# Patient Record
Sex: Male | Born: 1990 | Race: White | Hispanic: No | Marital: Single | Smoking: Never smoker
Health system: Southern US, Community
[De-identification: ages and names within clinical notes are randomized; demographics above are authoritative.]

## PROBLEM LIST (undated history)

## (undated) DIAGNOSIS — E669 Obesity, unspecified: Secondary | ICD-10-CM

## (undated) DIAGNOSIS — E785 Hyperlipidemia, unspecified: Secondary | ICD-10-CM

## (undated) DIAGNOSIS — J45909 Unspecified asthma, uncomplicated: Secondary | ICD-10-CM

## (undated) HISTORY — DX: Unspecified asthma, uncomplicated: J45.909

## (undated) HISTORY — PX: WISDOM TOOTH EXTRACTION: SHX21

## (undated) HISTORY — DX: Hyperlipidemia, unspecified: E78.5

## (undated) HISTORY — DX: Obesity, unspecified: E66.9

---

## 2016-09-12 ENCOUNTER — Encounter: Payer: Self-pay | Admitting: *Deleted

## 2016-09-12 ENCOUNTER — Emergency Department
Admission: EM | Admit: 2016-09-12 | Discharge: 2016-09-12 | Disposition: A | Discharge status: Home / Self Care | Payer: BLUE CROSS/BLUE SHIELD | Attending: Emergency Medicine | Admitting: Emergency Medicine

## 2016-09-12 ENCOUNTER — Emergency Department: Payer: BLUE CROSS/BLUE SHIELD

## 2016-09-12 DIAGNOSIS — B349 Viral infection, unspecified: Secondary | ICD-10-CM | POA: Insufficient documentation

## 2016-09-12 DIAGNOSIS — R0981 Nasal congestion: Secondary | ICD-10-CM | POA: Diagnosis present

## 2016-09-12 LAB — CBC WITH DIFFERENTIAL/PLATELET
Basophils Absolute: 0.1 10*3/uL (ref 0–0.1)
Basophils Relative: 1 %
EOS ABS: 0.1 10*3/uL (ref 0–0.7)
Eosinophils Relative: 2 %
HEMATOCRIT: 46.6 % (ref 40.0–52.0)
HEMOGLOBIN: 16 g/dL (ref 13.0–18.0)
LYMPHS ABS: 1.4 10*3/uL (ref 1.0–3.6)
LYMPHS PCT: 22 %
MCH: 29.7 pg (ref 26.0–34.0)
MCHC: 34.4 g/dL (ref 32.0–36.0)
MCV: 86.2 fL (ref 80.0–100.0)
MONOS PCT: 9 %
Monocytes Absolute: 0.6 10*3/uL (ref 0.2–1.0)
NEUTROS ABS: 4.3 10*3/uL (ref 1.4–6.5)
NEUTROS PCT: 66 %
Platelets: 218 10*3/uL (ref 150–440)
RBC: 5.41 MIL/uL (ref 4.40–5.90)
RDW: 13 % (ref 11.5–14.5)
WBC: 6.5 10*3/uL (ref 3.8–10.6)

## 2016-09-12 LAB — BASIC METABOLIC PANEL
Anion gap: 5 (ref 5–15)
BUN: 15 mg/dL (ref 6–20)
CHLORIDE: 104 mmol/L (ref 101–111)
CO2: 28 mmol/L (ref 22–32)
CREATININE: 1 mg/dL (ref 0.61–1.24)
Calcium: 9.3 mg/dL (ref 8.9–10.3)
GFR calc non Af Amer: >60 mL/min (ref >60)
Glucose, Bld: 104 mg/dL — ABNORMAL HIGH (ref 65–99)
Potassium: 4.2 mmol/L (ref 3.5–5.1)
Sodium: 137 mmol/L (ref 135–145)

## 2016-09-12 LAB — INFLUENZA PANEL BY PCR (TYPE A & B)
INFLAPCR: NEGATIVE
INFLBPCR: NEGATIVE

## 2016-09-12 LAB — URINALYSIS COMPLETE WITH MICROSCOPIC (ARMC ONLY)
BACTERIA UA: NONE SEEN
Bilirubin Urine: NEGATIVE
GLUCOSE, UA: NEGATIVE mg/dL
Hgb urine dipstick: NEGATIVE
Ketones, ur: NEGATIVE mg/dL
Leukocytes, UA: NEGATIVE
Nitrite: NEGATIVE
PROTEIN: NEGATIVE mg/dL
RBC / HPF: NONE SEEN RBC/hpf (ref 0–5)
SQUAMOUS EPITHELIAL / LPF: NONE SEEN
Specific Gravity, Urine: 1.009 (ref 1.005–1.030)
pH: 9 — ABNORMAL HIGH (ref 5.0–8.0)

## 2016-09-12 MED ORDER — ONDANSETRON HCL 4 MG/2ML IJ SOLN
4.0000 mg | Freq: Once | INTRAMUSCULAR | Status: AC
  Administered 2016-09-12: 4 mg via INTRAVENOUS
  Filled 2016-09-12: qty 2

## 2016-09-12 MED ORDER — SODIUM CHLORIDE 0.9 % IV BOLUS (SEPSIS)
1000.0000 mL | Freq: Once | INTRAVENOUS | Status: AC
  Administered 2016-09-12: 1000 mL via INTRAVENOUS

## 2016-09-12 MED ORDER — PSEUDOEPH-BROMPHEN-DM 30-2-10 MG/5ML PO SYRP: 5.0000 mL | ORAL_SOLUTION | Freq: Four times a day (QID) | ORAL | 0 refills | Status: DC | PRN

## 2016-09-12 MED ORDER — KETOROLAC TROMETHAMINE 30 MG/ML IJ SOLN
30.0000 mg | Freq: Once | INTRAMUSCULAR | Status: AC
  Administered 2016-09-12: 30 mg via INTRAVENOUS
  Filled 2016-09-12: qty 1

## 2016-09-12 MED ORDER — ONDANSETRON HCL 8 MG PO TABS
8.0000 mg | ORAL_TABLET | Freq: Three times a day (TID) | ORAL | 0 refills | Status: DC | PRN
Start: 2016-09-12 — End: 2017-01-02

## 2016-09-12 MED ORDER — IBUPROFEN 800 MG PO TABS: 800.0000 mg | ORAL_TABLET | Freq: Three times a day (TID) | ORAL | 0 refills | Status: DC | PRN

## 2016-09-12 NOTE — ED Triage Notes (Signed)
Pt complains of nausea, vomiting, body aches for 1 week, pt saw PCP diagnosed with a virus per pt

## 2016-09-12 NOTE — ED Notes (Signed)
See triage note  States he developed body aches with n/v ,fever last week  Was seen at Urgent care and then again by PCP  dx'd with virus..felt better on Thursday and Friday   But over the weekend the sx's returned  Vomited this am  Afebrile on arrival  NAD

## 2016-09-12 NOTE — ED Provider Notes (Signed)
[facility] Emergency Department Provider Note   ____________________________________________   None    (approximate)  I have reviewed the triage vital signs and the nursing notes.   HISTORY  Chief Complaint Nausea and Generalized Body Aches    HPI Shane Morrison is a 25 y.o. male patient complaining of 1 week intermitting fever/ chills, nasal congestion, sore throat nausea/ vomiting and generalized body aches. Patient has been seen by both urgent care and PCP and diagnosed with viral illness. Patient state he was medicine for nausea for 2 days. Patient states dental has not resolved. No other palliative measures for his complaint. Patient rates his pain as a 5/10. Patient describes pain as "achy".   History reviewed. No pertinent past medical history.  There are no active problems to display for this patient.   History reviewed. No pertinent surgical history.  Prior to Admission medications   Medication Sig Start Date End Date Taking? Authorizing Provider  brompheniramine-pseudoephedrine-DM 30-2-10 MG/5ML syrup Take 5 mLs by mouth 4 (four) times daily as needed. 09/12/16   Joni Reiningonald K Wildon Cuevas, PA-C  ibuprofen (ADVIL,MOTRIN) 800 MG tablet Take 1 tablet (800 mg total) by mouth every 8 (eight) hours as needed for moderate pain. 09/12/16   Joni Reiningonald K Marjoria Mancillas, PA-C  ondansetron (ZOFRAN) 8 MG tablet Take 1 tablet (8 mg total) by mouth every 8 (eight) hours as needed for nausea or vomiting. 09/12/16   Joni Reiningonald K Kemaya Dorner, PA-C    Allergies Patient has no known allergies.  No family history on file.  Social History Social History  Substance Use Topics  . Smoking status: Never Smoker  . Smokeless tobacco: Not on file  . Alcohol use Yes    Review of Systems Constitutional: No fever/chills Eyes: No visual changes. ENT: No sore throat. Cardiovascular: Denies chest pain. Respiratory: Denies shortness of breath. Gastrointestinal: No abdominal pain.  No nausea,  no vomiting.  No diarrhea.  No constipation. Genitourinary: Negative for dysuria. Musculoskeletal: Negative for back pain. Skin: Negative for rash. Neurological: Negative for headaches, focal weakness or numbness.    ____________________________________________   PHYSICAL EXAM:  VITAL SIGNS: ED Triage Vitals   Enc Vitals Group     BP (!) 145/86     Pulse Rate (!) 109     Resp 20     Temp 98.4 F (36.9 C)     Temp Source Oral     SpO2 99 %     Weight 300 lb (136.1 kg)     Height 5\' 11"  (1.803 m)     Head Circumference      Peak Flow      Pain Score      Pain Loc      Pain Edu?      Excl. in GC?     Constitutional: Alert and oriented. Well appearing and in no acute distress.Overweight Eyes: Conjunctivae are normal. PERRL. EOMI. Head: Atraumatic. Nose: Bilateral maxillary guarding edematous nasal turbinates. Mouth/Throat: Mucous membranes are moist.  Oropharynx non-erythematous. Neck: No stridor.  No cervical spine tenderness to palpation. Hematological/Lymphatic/Immunilogical: No cervical lymphadenopathy. Cardiovascular: Normal rate, regular rhythm. Grossly normal heart sounds.  Good peripheral circulation. Respiratory: Normal respiratory effort.  No retractions. Lungs CTAB. Gastrointestinal: Soft and nontender. No distention. No abdominal bruits. No CVA tenderness. Hyperactive bowel sounds Musculoskeletal: No lower extremity tenderness nor edema.  No joint effusions. Neurologic:  Normal speech and language. No gross focal neurologic deficits are appreciated. No gait instability. Skin:  Skin is warm, dry and  intact. No rash noted. Psychiatric: Mood and affect are normal. Speech and behavior are normal.  ____________________________________________   LABS (all labs ordered are listed, but only abnormal results are displayed)  Labs Reviewed  BASIC METABOLIC PANEL - Abnormal; Notable for the following:       Result Value   Glucose, Bld 104 (*)    All other  components within normal limits  URINALYSIS COMPLETEWITH MICROSCOPIC ([facility] ONLY) - Abnormal; Notable for the following:    Color, Urine STRAW (*)    APPearance CLEAR (*)    pH 9.0 (*)    All other components within normal limits  CBC WITH DIFFERENTIAL/PLATELET  INFLUENZA PANEL BY PCR (TYPE A & B, H1N1)   ____________________________________________  EKG   ____________________________________________  RADIOLOGY  No acute final chest x-ray ____________________________________________   PROCEDURES  Procedure(s) performed: None  Procedures  Critical Care performed: No  ____________________________________________   INITIAL IMPRESSION / ASSESSMENT AND PLAN / ED COURSE  Pertinent labs & imaging results that were available during my care of the patient were reviewed by me and considered in my medical decision making (see chart for details).  Viral illness. Discussed negative findings of x-ray of the chest and the results. Patient was negative for flu. Patient given discharge care instructions and advised follow-up family doctor.  Clinical Course   Patient Baseline labs and chest x-ray unremarkable. Patient also had a negative flu test. Patient was rehydrated using 1000 cc normal saline and given Toradol for body aches and pain. Patient states body aches has decreased.   ____________________________________________   FINAL CLINICAL IMPRESSION(S) / ED DIAGNOSES  Final diagnoses:  Viral illness      NEW MEDICATIONS STARTED DURING THIS VISIT:  New Prescriptions   BROMPHENIRAMINE-PSEUDOEPHEDRINE-DM 30-2-10 MG/5ML SYRUP    Take 5 mLs by mouth 4 (four) times daily as needed.   IBUPROFEN (ADVIL,MOTRIN) 800 MG TABLET    Take 1 tablet (800 mg total) by mouth every 8 (eight) hours as needed for moderate pain.   ONDANSETRON (ZOFRAN) 8 MG TABLET    Take 1 tablet (8 mg total) by mouth every 8 (eight) hours as needed for nausea or vomiting.     Note:  This document was  prepared using Dragon voice recognition software and may include unintentional dictation errors.    Joni Reiningonald K Juanmiguel Defelice, PA-C 09/12/16 1251    Jene Everyobert Kinner, MD 09/12/16 [phone]

## 2016-12-08 ENCOUNTER — Telehealth: Payer: Self-pay | Admitting: Gastroenterology

## 2016-12-08 NOTE — Telephone Encounter (Signed)
Attempted to call patient to schedule appt with GI for blood in stool, chronic nausea, and abdominal pain, lower. Referred by [facility]. No voice mail

## 2016-12-15 ENCOUNTER — Ambulatory Visit (INDEPENDENT_AMBULATORY_CARE_PROVIDER_SITE_OTHER): Payer: BLUE CROSS/BLUE SHIELD | Admitting: Gastroenterology

## 2016-12-15 ENCOUNTER — Other Ambulatory Visit: Payer: Self-pay

## 2016-12-15 ENCOUNTER — Encounter: Payer: Self-pay | Admitting: Gastroenterology

## 2016-12-15 VITALS — BP 143/71 | HR 97 | Temp 98.4°F | Ht 71.0 in | Wt 296.5 lb

## 2016-12-15 DIAGNOSIS — K921 Melena: Secondary | ICD-10-CM

## 2016-12-15 DIAGNOSIS — R1030 Lower abdominal pain, unspecified: Secondary | ICD-10-CM

## 2016-12-15 NOTE — Progress Notes (Signed)
Gastroenterology Consultation  Referring Provider:     Sherron Morrison, Shane Ahmed, MD Primary Care Physician:  Shane Morrison, Shane AHMED, MD Primary Gastroenterologist:  Dr. Servando Morrison     Reason for Consultation:     Hematochezia        HPI:   Shane Morrison is a 26 y.o. y/o male referred for consultation & management of Hematochezia by Dr. Ellsworth Morrison, Shane CoriaSHEIKH AHMED, MD.  The patient comes in today with a report of rectal bleeding. The patient states it started last Wednesday and he had more bleeding on Thursday. The patient also reports that the rectal bleeding is dark blood mixed with his stools. There is no report of any nausea vomiting but he does report lower abdominal pain. The patient denies any constipation or change in bowel habits. The patient also denies any family history of colon cancer colon polyps. The patient was concerned because he had a lower abdominal pain with rectal bleeding so his doctor sent him to me for evaluation. He denies having these symptoms in the past. There is also no report of any unexplained weight loss.   Past Medical History:  Diagnosis Date  . Asthma   . Hyperlipidemia   . Obesity     Past Surgical History:  Procedure Laterality Date  . NO PAST SURGERIES      Prior to Admission medications   Medication Sig Start Date End Date Taking? Authorizing Provider  brompheniramine-pseudoephedrine-DM 30-2-10 MG/5ML syrup Take 5 mLs by mouth 4 (four) times daily as needed. Patient not taking: Reported on 12/15/2016 09/12/16   Shane Reiningonald K Smith, PA-C  ibuprofen (ADVIL,MOTRIN) 800 MG tablet Take 1 tablet (800 mg total) by mouth every 8 (eight) hours as needed for moderate pain. Patient not taking: Reported on 12/15/2016 09/12/16   Shane Reiningonald K Smith, PA-C  ondansetron (ZOFRAN) 8 MG tablet Take 1 tablet (8 mg total) by mouth every 8 (eight) hours as needed for nausea or vomiting. Patient not taking: Reported on 12/15/2016 09/12/16   Shane Reiningonald K Smith, PA-C    Family History  Problem  Relation Age of Onset  . Hypertension Mother   . Hyperlipidemia Mother      Social History  Substance Use Topics  . Smoking status: Never Smoker  . Smokeless tobacco: Current User    Types: Chew  . Alcohol use Yes    Allergies as of 12/15/2016  . (No Known Allergies)    Review of Systems:    All systems reviewed and negative except where noted in HPI.   Physical Exam:  BP (!) 143/71   Pulse 97   Temp 98.4 F (36.9 C) (Oral)   Ht 5\' 11"  (1.803 m)   Wt 296 lb 8 oz (134.5 kg)   BMI 41.35 kg/m  No LMP for male patient. Psych:  Alert and cooperative. Normal mood and affect. General:   Alert,  Well-developed, Obese, well-nourished, pleasant and cooperative in NAD Head:  Normocephalic and atraumatic. Eyes:  Sclera clear, no icterus.   Conjunctiva pink. Ears:  Normal auditory acuity. Nose:  No deformity, discharge, or lesions. Mouth:  No deformity or lesions,oropharynx pink & moist. Neck:  Supple; no masses or thyromegaly. Lungs:  Respirations even and unlabored.  Clear throughout to auscultation.   No wheezes, crackles, or rhonchi. No acute distress. Heart:  Regular rate and rhythm; no murmurs, clicks, rubs, or gallops. Abdomen:  Normal bowel sounds.  No bruits.  Soft, non-tender and non-distended without masses, hepatosplenomegaly or hernias noted.  No guarding or  rebound tenderness.  Negative Carnett sign.   Rectal:  Deferred.  Msk:  Symmetrical without gross deformities.  Good, equal movement & strength bilaterally. Pulses:  Normal pulses noted. Extremities:  No clubbing or edema.  No cyanosis. Neurologic:  Alert and oriented x3;  grossly normal neurologically. Skin:  Intact without significant lesions or rashes.  No jaundice. Lymph Nodes:  No significant cervical adenopathy. Psych:  Alert and cooperative. Normal mood and affect.  Imaging Studies: No results found.  Assessment and Plan:   Shane Morrison is a 26 y.o. y/o male who comes in today with rectal  bleeding of dark material that is mixed with the stool that started approximate one week ago. The patient will be set up for colonoscopy to rule out any neoplasm as the cause of his rectal bleeding and lower abdominal pain.I have discussed risks & benefits which include, but are not limited to, bleeding, infection, perforation & drug reaction.  The patient agrees with this plan & written consent will be obtained.       Midge Minium, MD. Clementeen Graham   Note: This dictation was prepared with Dragon dictation along with smaller phrase technology. Any transcriptional errors that result from this process are unintentional.

## 2016-12-19 ENCOUNTER — Other Ambulatory Visit: Payer: Self-pay

## 2017-01-02 ENCOUNTER — Encounter: Payer: Self-pay | Admitting: *Deleted

## 2017-01-05 ENCOUNTER — Other Ambulatory Visit: Payer: Self-pay

## 2017-01-05 DIAGNOSIS — K921 Melena: Secondary | ICD-10-CM

## 2017-01-05 NOTE — Discharge Instructions (Signed)

## 2017-01-06 ENCOUNTER — Ambulatory Visit: Payer: BLUE CROSS/BLUE SHIELD | Admitting: Anesthesiology

## 2017-01-06 ENCOUNTER — Encounter
Admission: RE | Disposition: A | Discharge status: Home / Self Care | Payer: Self-pay | Source: Ambulatory Visit | Attending: Gastroenterology

## 2017-01-06 ENCOUNTER — Ambulatory Visit
Admission: RE | Admit: 2017-01-06 | Discharge: 2017-01-06 | Disposition: A | Discharge status: Home / Self Care | Payer: BLUE CROSS/BLUE SHIELD | Source: Ambulatory Visit | Attending: Gastroenterology | Admitting: Gastroenterology

## 2017-01-06 DIAGNOSIS — Z6839 Body mass index (BMI) 39.0-39.9, adult: Secondary | ICD-10-CM | POA: Insufficient documentation

## 2017-01-06 DIAGNOSIS — E785 Hyperlipidemia, unspecified: Secondary | ICD-10-CM | POA: Diagnosis not present

## 2017-01-06 DIAGNOSIS — E669 Obesity, unspecified: Secondary | ICD-10-CM | POA: Insufficient documentation

## 2017-01-06 DIAGNOSIS — K921 Melena: Secondary | ICD-10-CM | POA: Diagnosis not present

## 2017-01-06 DIAGNOSIS — K640 First degree hemorrhoids: Secondary | ICD-10-CM | POA: Insufficient documentation

## 2017-01-06 HISTORY — PX: COLONOSCOPY WITH PROPOFOL: SHX5780

## 2017-01-06 SURGERY — COLONOSCOPY WITH PROPOFOL
Anesthesia: Choice

## 2017-01-06 SURGERY — COLONOSCOPY WITH PROPOFOL
Anesthesia: Monitor Anesthesia Care | Wound class: Contaminated

## 2017-01-06 MED ORDER — PROPOFOL 10 MG/ML IV BOLUS
INTRAVENOUS | Status: DC | PRN
  Administered 2017-01-06: 100 mg via INTRAVENOUS
  Administered 2017-01-06 (×3): 50 mg via INTRAVENOUS

## 2017-01-06 MED ORDER — LIDOCAINE HCL (CARDIAC) 20 MG/ML IV SOLN
INTRAVENOUS | Status: DC | PRN
  Administered 2017-01-06 (×2): 50 mg via INTRAVENOUS

## 2017-01-06 MED ORDER — SIMETHICONE 40 MG/0.6ML PO SUSP
ORAL | Status: DC | PRN
  Administered 2017-01-06: 11:00:00

## 2017-01-06 MED ORDER — LACTATED RINGERS IV SOLN
INTRAVENOUS | Status: DC
  Administered 2017-01-06: 10:00:00 via INTRAVENOUS

## 2017-01-06 SURGICAL SUPPLY — 35 items
BALLN DILATOR 10-12 8 (BALLOONS)
BALLN DILATOR 12-15 8 (BALLOONS)
BALLN DILATOR 15-18 8 (BALLOONS)
BALLN DILATOR CRE 0-12 8 (BALLOONS)
BALLN DILATOR ESOPH 8 10 CRE (MISCELLANEOUS) IMPLANT
BALLOON DILATOR 12-15 8 (BALLOONS) IMPLANT
BALLOON DILATOR 15-18 8 (BALLOONS) IMPLANT
BALLOON DILATOR CRE 0-12 8 (BALLOONS) IMPLANT
BLOCK BITE 60FR ADLT L/F GRN (MISCELLANEOUS) ×2 IMPLANT
CANISTER SUCT 1200ML W/VALVE (MISCELLANEOUS) ×2 IMPLANT
CLIP HMST 235XBRD CATH ROT (MISCELLANEOUS) IMPLANT
CLIP RESOLUTION 360 11X235 (MISCELLANEOUS)
FCP ESCP3.2XJMB 240X2.8X (MISCELLANEOUS)
FORCEPS BIOP RAD 4 LRG CAP 4 (CUTTING FORCEPS) IMPLANT
FORCEPS BIOP RJ4 240 W/NDL (MISCELLANEOUS)
FORCEPS ESCP3.2XJMB 240X2.8X (MISCELLANEOUS) IMPLANT
GOWN CVR UNV OPN BCK APRN NK (MISCELLANEOUS) ×2 IMPLANT
GOWN ISOL THUMB LOOP REG UNIV (MISCELLANEOUS) ×2
INJECTOR VARIJECT VIN23 (MISCELLANEOUS) IMPLANT
KIT DEFENDO VALVE AND CONN (KITS) IMPLANT
KIT ENDO PROCEDURE OLY (KITS) ×2 IMPLANT
MARKER SPOT ENDO TATTOO 5ML (MISCELLANEOUS) IMPLANT
PAD GROUND ADULT SPLIT (MISCELLANEOUS) IMPLANT
PROBE APC STR FIRE (PROBE) IMPLANT
RETRIEVER NET PLAT FOOD (MISCELLANEOUS) IMPLANT
RETRIEVER NET ROTH 2.5X230 LF (MISCELLANEOUS) IMPLANT
SNARE SHORT THROW 13M SML OVAL (MISCELLANEOUS) IMPLANT
SNARE SHORT THROW 30M LRG OVAL (MISCELLANEOUS) IMPLANT
SNARE SNG USE RND 15MM (INSTRUMENTS) IMPLANT
SPOT EX ENDOSCOPIC TATTOO (MISCELLANEOUS)
SYR INFLATION 60ML (SYRINGE) IMPLANT
TRAP ETRAP POLY (MISCELLANEOUS) IMPLANT
VARIJECT INJECTOR VIN23 (MISCELLANEOUS)
WATER STERILE IRR 250ML POUR (IV SOLUTION) ×2 IMPLANT
WIRE CRE 18-20MM 8CM F G (MISCELLANEOUS) IMPLANT

## 2017-01-06 NOTE — Anesthesia Postprocedure Evaluation (Signed)
Anesthesia Post Note  Patient: Shane Morrison  Procedure(s) Performed: Procedure(s) (LRB): COLONOSCOPY WITH PROPOFOL (N/A)  Patient location during evaluation: PACU Anesthesia Type: MAC Level of consciousness: awake and alert Pain management: pain level controlled Vital Signs Assessment: post-procedure vital signs reviewed and stable Respiratory status: spontaneous breathing, nonlabored ventilation, respiratory function stable and patient connected to nasal cannula oxygen Cardiovascular status: stable and blood pressure returned to baseline Anesthetic complications: no    Alisa Graff

## 2017-01-06 NOTE — Anesthesia Preprocedure Evaluation (Signed)
Anesthesia Evaluation  Patient identified by MRN, date of birth, ID band Patient awake    Reviewed: Allergy & Precautions, H&P , NPO status , Patient's Chart, lab work & pertinent test results, reviewed documented beta blocker date and time   Airway Mallampati: II  TM Distance: >3 FB Neck ROM: full    Dental no notable dental hx.    Pulmonary asthma ,    Pulmonary exam normal breath sounds clear to auscultation       Cardiovascular Exercise Tolerance: Good negative cardio ROS   Rhythm:regular Rate:Normal     Neuro/Psych negative neurological ROS  negative psych ROS   GI/Hepatic negative GI ROS, Neg liver ROS,   Endo/Other  negative endocrine ROS  Renal/GU negative Renal ROS  negative genitourinary   Musculoskeletal   Abdominal   Peds  Hematology negative hematology ROS (+)   Anesthesia Other Findings   Reproductive/Obstetrics negative OB ROS                             Anesthesia Physical Anesthesia Plan  ASA: II  Anesthesia Plan: MAC   Post-op Pain Management:    Induction:   Airway Management Planned:   Additional Equipment:   Intra-op Plan:   Post-operative Plan:   Informed Consent: I have reviewed the patients History and Physical, chart, labs and discussed the procedure including the risks, benefits and alternatives for the proposed anesthesia with the patient or authorized representative who has indicated his/her understanding and acceptance.   Dental Advisory Given  Plan Discussed with: CRNA  Anesthesia Plan Comments:         Anesthesia Quick Evaluation

## 2017-01-06 NOTE — Op Note (Signed)
[facility] Gastroenterology Patient Name: Shane Morrison Procedure Date: 01/06/2017 10:44 AM MRN: [id number] Account #: [id number] Date of Birth: 07/01/1991 Admit Type: Outpatient Age: [age] [facility] Gender: Male Note Status: Finalized Procedure:            Colonoscopy Indications:          Hematochezia Providers:            Midge Miniumarren Kalissa Grays MD, MD Referring MD:         Silas FloodSheikh A. Ellsworth Lennoxejan-sie, MD (Referring MD) Medicines:            Propofol per Anesthesia Complications:        No immediate complications. Procedure:            Pre-Anesthesia Assessment:                       - Prior to the procedure, a History and Physical was                        performed, and patient medications and allergies were                        reviewed. The patient's tolerance of previous                        anesthesia was also reviewed. The risks and benefits of                        the procedure and the sedation options and risks were                        discussed with the patient. All questions were                        answered, and informed consent was obtained. Prior                        Anticoagulants: The patient has taken no previous                        anticoagulant or antiplatelet agents. ASA Grade                        Assessment: II - A patient with mild systemic disease.                        After reviewing the risks and benefits, the patient was                        deemed in satisfactory condition to undergo the                        procedure.                       After obtaining informed consent, the colonoscope was                        passed under direct vision. Throughout the procedure,  the patient's blood pressure, pulse, and oxygen                        saturations were monitored continuously. The Olympus                        190 Colonoscope [id number]) was introduced through the   anus and advanced to the the cecum, identified by                        appendiceal orifice and ileocecal valve. The                        colonoscopy was performed without difficulty. The                        patient tolerated the procedure well. The quality of                        the bowel preparation was excellent. Findings:      The perianal and digital rectal examinations were normal.      Non-bleeding internal hemorrhoids were found during retroflexion. The       hemorrhoids were Grade I (internal hemorrhoids that do not prolapse). Impression:           - Non-bleeding internal hemorrhoids.                       - No specimens collected. Recommendation:       - Discharge patient to home.                       - Resume previous diet.                       - Continue present medications. Procedure Code(s):    --- Professional ---                       [phone], Colonoscopy, flexible; diagnostic, including                        collection of specimen(s) by brushing or washing, when                        performed (separate procedure) Diagnosis Code(s):    --- Professional ---                       K92.1, Melena (includes Hematochezia) CPT copyright 2016 American Medical Association. All rights reserved. The codes documented in this report are preliminary and upon coder review may  be revised to meet current compliance requirements. Midge Minium MD, MD 01/06/2017 10:58:12 AM This report has been signed electronically. Number of Addenda: 0 Note Initiated On: 01/06/2017 10:44 AM Scope Withdrawal Time: 0 hours 7 minutes 8 seconds  Total Procedure Duration: 0 hours 9 minutes 41 seconds       [facility]

## 2017-01-06 NOTE — Anesthesia Procedure Notes (Signed)
Procedure Name: MAC Performed by: Kadeja Granada Pre-anesthesia Checklist: Patient identified, Emergency Drugs available, Suction available, Timeout performed and Patient being monitored Patient Re-evaluated:Patient Re-evaluated prior to inductionOxygen Delivery Method: Nasal cannula Placement Confirmation: positive ETCO2     

## 2017-01-06 NOTE — H&P (Signed)
  Midge Miniumarren Meriah Shands, MD [facility] [address] [place], [place] [address] Phone: [phone] Fax : [phone]  Primary Care Physician:  Luna FuseEJAN-SIE, [name], MD Primary Gastroenterologist:  Dr. Servando SnareWohl  Pre-Procedure History & Physical: HPI:  Shane DerJeffrey Krenzer is a 26 y.o. male is here for an colonoscopy.   Past Medical History:  Diagnosis Date  . Asthma    childhood  . Hyperlipidemia   . Obesity     Past Surgical History:  Procedure Laterality Date  . WISDOM TOOTH EXTRACTION      Prior to Admission medications   Not on File    Allergies as of 12/19/2016  . (No Known Allergies)    Family History  Problem Relation Age of Onset  . Hypertension Mother   . Hyperlipidemia Mother     Social History   Social History  . Marital status: Married    Spouse name: N/A  . Number of children: N/A  . Years of education: N/A   Occupational History  . Not on file.   Social History Main Topics  . Smoking status: Never Smoker  . Smokeless tobacco: Current User    Types: Chew  . Alcohol use 9.0 oz/week    15 Cans of beer per week  . Drug use: No  . Sexual activity: Not on file   Other Topics Concern  . Not on file   Social History Narrative  . No narrative on file    Review of Systems: See HPI, otherwise negative ROS  Physical Exam: BP 129/75   Pulse 85   Temp 98.2 F (36.8 C) (Temporal)   Resp 16   Ht 5\' 11"  (1.803 m)   Wt 284 lb (128.8 kg)   SpO2 100%   BMI 39.61 kg/m  General:   Alert,  pleasant and cooperative in NAD Head:  Normocephalic and atraumatic. Neck:  Supple; no masses or thyromegaly. Lungs:  Clear throughout to auscultation.    Heart:  Regular rate and rhythm. Abdomen:  Soft, nontender and nondistended. Normal bowel sounds, without guarding, and without rebound.   Neurologic:  Alert and  oriented x4;  grossly normal neurologically.  Impression/Plan: Shane Morrison is here for an colonoscopy to be performed for  hematochezia  Risks, benefits, limitations, and alternatives regarding  colonoscopy have been reviewed with the patient.  Questions have been answered.  All parties agreeable.   Midge Miniumarren Sharnay Cashion, MD  01/06/2017, 10:08 AM

## 2017-01-06 NOTE — Transfer of Care (Signed)
Immediate Anesthesia Transfer of Care Note  Patient: Shane Morrison  Procedure(s) Performed: Procedure(s): COLONOSCOPY WITH PROPOFOL (N/A)  Patient Location: PACU  Anesthesia Type: MAC  Level of Consciousness: awake, alert  and patient cooperative  Airway and Oxygen Therapy: Patient Spontanous Breathing and Patient connected to supplemental oxygen  Post-op Assessment: Post-op Vital signs reviewed, Patient's Cardiovascular Status Stable, Respiratory Function Stable, Patent Airway and No signs of Nausea or vomiting  Post-op Vital Signs: Reviewed and stable  Complications: No apparent anesthesia complications

## 2017-01-09 ENCOUNTER — Encounter: Payer: Self-pay | Admitting: Gastroenterology

## 2019-02-11 ENCOUNTER — Telehealth: Payer: Self-pay

## 2019-02-11 ENCOUNTER — Other Ambulatory Visit: Payer: Self-pay

## 2019-02-11 ENCOUNTER — Telehealth: Payer: BLUE CROSS/BLUE SHIELD | Admitting: Urology

## 2019-02-11 NOTE — Telephone Encounter (Signed)
-----   Message from Sondra Come, MD sent at 02/11/2019 12:23 PM EDT ----- Regarding: reschedule Unable to reach via phone today for noon visit.   His prostatic calcifications on CT are a benign finding, no workup needed or further follow up, unless he would like to re-schedule his appointment or is having any urinary symptoms.  Legrand Rams, MD 02/11/2019

## 2019-02-14 ENCOUNTER — Ambulatory Visit: Payer: BLUE CROSS/BLUE SHIELD | Admitting: Urology

## 2019-03-28 ENCOUNTER — Ambulatory Visit: Payer: BLUE CROSS/BLUE SHIELD | Admitting: Urology

## 2019-06-11 DIAGNOSIS — K219 Gastro-esophageal reflux disease without esophagitis: Secondary | ICD-10-CM | POA: Insufficient documentation

## 2019-11-11 DIAGNOSIS — U071 COVID-19: Secondary | ICD-10-CM | POA: Insufficient documentation

## 2019-11-11 HISTORY — DX: COVID-19: U07.1

## 2020-02-12 ENCOUNTER — Other Ambulatory Visit: Payer: Self-pay

## 2020-02-12 ENCOUNTER — Encounter: Payer: Self-pay | Admitting: Urology

## 2020-02-12 ENCOUNTER — Ambulatory Visit: Payer: Managed Care, Other (non HMO) | Admitting: Urology

## 2020-02-12 VITALS — BP 163/114 | HR 106 | Ht 71.0 in | Wt 300.0 lb

## 2020-02-12 DIAGNOSIS — N5082 Scrotal pain: Secondary | ICD-10-CM | POA: Diagnosis not present

## 2020-02-12 DIAGNOSIS — R102 Pelvic and perineal pain: Secondary | ICD-10-CM

## 2020-02-12 LAB — URINALYSIS, COMPLETE
Bilirubin, UA: NEGATIVE
Ketones, UA: NEGATIVE
Leukocytes,UA: NEGATIVE
Nitrite, UA: NEGATIVE
Protein,UA: NEGATIVE
RBC, UA: NEGATIVE
Specific Gravity, UA: 1.025 (ref 1.005–1.030)
Urobilinogen, Ur: 0.2 mg/dL (ref 0.2–1.0)
pH, UA: 6 (ref 5.0–7.5)

## 2020-02-12 LAB — MICROSCOPIC EXAMINATION
Bacteria, UA: NONE SEEN
RBC: NONE SEEN /hpf (ref 0–2)

## 2020-02-12 MED ORDER — AMITRIPTYLINE HCL 25 MG PO TABS
25.0000 mg | ORAL_TABLET | Freq: Every day | ORAL | 0 refills | Status: AC
Start: 2020-02-12 — End: ?

## 2020-02-12 NOTE — Progress Notes (Signed)
   02/12/2020 12:26 PM   Shane Morrison 1991/05/07 [id number]  Referring provider: Margaretann Loveless, MD [address] [place],  [place] [address]  Chief Complaint  Patient presents with  . Other    HPI: Shane Morrison is a 29 y.o. male seen at the request of Dr. Yves Dill for evaluation of groin pain.  -1 year history of left lower abdominal pain radiating to the left hemiscrotum and medial thigh -Symptoms recently worsen return this week he also developed similar pain on the contralateral side radiating to the right hemiscrotum and medial thigh -Notices a small knot in the left lower abdominal region that will occasionally protrude with straining -No identifiable precipitating, aggravating or alleviating factors -Severity rated mild to moderate -No bothersome lower urinary tract symptoms -CT scan 2019 showed no urinary tract stones.  Small prostatic calcifications were incidentally noted -Positive chronic low back pain  PMH: Past Medical History:  Diagnosis Date  . Asthma    childhood  . Hyperlipidemia   . Obesity     Surgical History: Past Surgical History:  Procedure Laterality Date  . COLONOSCOPY WITH PROPOFOL N/A 01/06/2017   Procedure: COLONOSCOPY WITH PROPOFOL;  Surgeon: Midge Minium, MD;  Location: [facility];  Service: Endoscopy;  Laterality: N/A;  . WISDOM TOOTH EXTRACTION      Home Medications:  Allergies as of 02/12/2020   No Known Allergies     Medication List       Accurate as of February 12, 2020 12:26 PM. If you have any questions, ask your nurse or doctor.        albuterol 108 (90 Base) MCG/ACT inhaler Commonly known as: VENTOLIN HFA SMARTSIG:1-2 Puff(s) By Mouth Every 4-6 Hours PRN   Cholecalciferol 1.25 MG (50000 UT) capsule Vitamin D3 1.25 MG (50000 UT) Oral Capsule QTY: 12 capsule Days: 84 Refills: 3  Written: 01/29/19 Patient Instructions: once a week       Allergies: No Known Allergies  Family History: Family History    Problem Relation Age of Onset  . Hypertension Mother   . Hyperlipidemia Mother     Social History:  reports that he has never smoked. His smokeless tobacco use includes chew. He reports current alcohol use of about 15.0 standard drinks of alcohol per week. He reports that he does not use drugs.   Physical Exam: BP (!) 163/114   Pulse (!) 106   Ht 5\' 11"  (1.803 m)   Wt 300 lb (136.1 kg)   BMI 41.84 kg/m   Constitutional:  Alert and oriented, No acute distress. HEENT: [place] AT, moist mucus membranes.  Trachea midline, no masses. Cardiovascular: No clubbing, cyanosis, or edema. Respiratory: Normal respiratory effort, no increased work of breathing. GI: Abdomen is soft, nontender, nondistended, no abdominal masses  GU: No inguinal hernia appreciated on today's exam.  Phallus retracted into suprapubic fat testes descended bilaterally without masses or tenderness Skin: No rashes, bruises or suspicious lesions. Neurologic: Grossly intact, no focal deficits, moving all 4 extremities. Psychiatric: Normal mood and affect.   Assessment & Plan:    - Pelvic/scrotal pain Unremarkable exam.  We discussed that although scrotal pain is common a etiology is not always identified.  Suspicious for neuropathic pain with pain in the medial thigh.  Will schedule a scrotal sonogram.  Trial amitriptyline 25 mg nightly.  , MD  [facility] [address], [address] [place], [place] [phone]

## 2020-02-14 ENCOUNTER — Other Ambulatory Visit: Payer: Self-pay

## 2020-02-14 ENCOUNTER — Ambulatory Visit
Admission: RE | Admit: 2020-02-14 | Discharge: 2020-02-14 | Disposition: A | Discharge status: Home / Self Care | Payer: Managed Care, Other (non HMO) | Source: Ambulatory Visit | Attending: Urology | Admitting: Urology

## 2020-02-14 DIAGNOSIS — R102 Pelvic and perineal pain: Secondary | ICD-10-CM | POA: Diagnosis present

## 2020-02-14 DIAGNOSIS — N5082 Scrotal pain: Secondary | ICD-10-CM | POA: Diagnosis present

## 2020-02-18 ENCOUNTER — Telehealth: Payer: Self-pay

## 2020-02-18 NOTE — Telephone Encounter (Signed)
Patient called looking for ultrasound results.

## 2020-02-18 NOTE — Telephone Encounter (Signed)
Tried to call patient, patient does not have a voicemail box set up, unable to leave voicemail

## 2020-02-18 NOTE — Telephone Encounter (Signed)
Please let Mr. Vernet know that his scrotal ultrasound did not find anything to cause his pain.  It was normal.

## 2020-02-19 NOTE — Telephone Encounter (Signed)
Notified patient as instructed , Patient would like to know the next step. He is still having pain.

## 2020-02-19 NOTE — Telephone Encounter (Signed)
Patient stop taking the Amitriptyline. Advised him to take medication as directed. He is going to call his PCP because he thinks it is a hernia causing the pain

## 2020-02-19 NOTE — Telephone Encounter (Signed)
Per Dr. Heywood Footman note, he felt that it may be a neuropathic pain.  Is Shane Morrison taking his amitriptyline?

## 2020-02-25 ENCOUNTER — Telehealth: Payer: Self-pay | Admitting: *Deleted

## 2020-02-25 NOTE — Telephone Encounter (Signed)
Left message

## 2020-02-25 NOTE — Telephone Encounter (Signed)
-----   Message from Riki Altes, MD sent at 02/25/2020 12:23 PM EDT ----- Scrotal ultrasound showed no abnormalities

## 2020-02-27 ENCOUNTER — Encounter: Payer: Self-pay | Admitting: General Surgery

## 2020-02-27 ENCOUNTER — Ambulatory Visit: Payer: Managed Care, Other (non HMO) | Admitting: General Surgery

## 2020-02-27 ENCOUNTER — Other Ambulatory Visit: Payer: Self-pay

## 2020-02-27 VITALS — BP 139/92 | HR 134 | Temp 97.3°F | Ht 71.0 in | Wt 298.6 lb

## 2020-02-27 DIAGNOSIS — R1030 Lower abdominal pain, unspecified: Secondary | ICD-10-CM

## 2020-02-27 NOTE — Progress Notes (Signed)
Patient ID: Shane Morrison, male   DOB: [birth year], 29 y.o.   MRN: [id number]  Chief Complaint  Patient presents with  . New Patient (Initial Visit)    new pt ref Dr.Neelam Humphrey Rolls left groin pain/possible inguinal    HPI Shane Morrison is a 29 y.o. male.   He has been referred by Dr. Lamonte Sakai for evaluation of pain in the left groin and testicular area with a question as to whether or not he may have a hernia..  The pain has been present for over a year and has been intermittent but seems to be becoming more frequent.  The pain is sharp and burning and seems to radiate from the lower abdomen down into his anterior thigh and scrotum.  He initially experienced primarily left-sided pain, but is now having right-sided pain.  At its worst, it rates about a 7-8 out of 10.  It is exacerbated by driving, lifting, bending,  twisting etc.  It seems to only be alleviated by rest.  The patient reports that there is bulging present in his lower abdomen.  According to his PCP notes from February 10, 2020, no hernia was identified on physical exam in their office.  Mr. Hausen has also been seen in urology for this same problem.  His appointment with Dr. Bernardo Heater was on February 12, 2020.  At that visit, Dr. Bernardo Heater did not appreciate any hernia on his exam.  Dr. Dene Gentry note indicates that he was suspicious for neuropathic pain, given the character of the pain and its radiating nature.  He prescribed amitriptyline and ordered an ultrasound for further evaluation.  The ultrasound was performed on February 14, 2020.  There was no evidence of hernia seen on this study although there was a small hydrocele on the right.  No etiology for his pain was identified on that study.   Past Medical History:  Diagnosis Date  . Asthma    childhood  . Hyperlipidemia   . Obesity     Past Surgical History:  Procedure Laterality Date  . COLONOSCOPY WITH PROPOFOL N/A 01/06/2017   Procedure: COLONOSCOPY WITH PROPOFOL;  Surgeon: Lucilla Lame, MD;  Location: [facility];  Service: Endoscopy;  Laterality: N/A;  . WISDOM TOOTH EXTRACTION      Family History  Problem Relation Age of Onset  . Hypertension Mother   . Hyperlipidemia Mother     Social History Social History   Tobacco Use  . Smoking status: Never Smoker  . Smokeless tobacco: Current User    Types: Chew  Substance Use Topics  . Alcohol use: Yes    Alcohol/week: 15.0 standard drinks    Types: 15 Cans of beer per week  . Drug use: No    No Known Allergies  Current Outpatient Medications  Medication Sig Dispense Refill  . albuterol (VENTOLIN HFA) 108 (90 Base) MCG/ACT inhaler SMARTSIG:1-2 Puff(s) By Mouth Every 4-6 Hours PRN    . amitriptyline (ELAVIL) 25 MG tablet Take 1 tablet (25 mg total) by mouth at bedtime. (Patient not taking: Reported on 02/27/2020) 30 tablet 0  . Cholecalciferol 1.25 MG (50000 UT) capsule Vitamin D3 1.25 MG (50000 UT) Oral Capsule QTY: 12 capsule Days: 84 Refills: 3  Written: 01/29/19 Patient Instructions: once a week     No current facility-administered medications for this visit.    Review of Systems Review of Systems  All other systems reviewed and are negative.   Blood pressure (!) 139/92, pulse (!) 134, temperature (!) 97.3 F (  36.3 C), temperature source Temporal, height 5\' 11"  (1.803 m), weight 298 lb 9.6 oz (135.4 kg), SpO2 99 %. Body mass index is 41.65 kg/m.  Physical Exam Physical Exam Vitals reviewed. Exam conducted with a chaperone present.  Constitutional:      General: He is not in acute distress.    Appearance: He is obese.  HENT:     Head: Normocephalic and atraumatic.     Nose: Nose normal.     Comments: Intermittently covered with a mask due to COVID-19 precautions    Mouth/Throat:     Comments: Covered with a mask secondary to COVID-19 precautions Eyes:     General: No scleral icterus.       Right eye: No discharge.        Left eye: No discharge.     Conjunctiva/sclera: Conjunctivae  normal.  Neck:     Comments: No dominant thyroid masses or thyromegaly appreciated. Cardiovascular:     Rate and Rhythm: Regular rhythm. Tachycardia present.     Pulses: Normal pulses.  Pulmonary:     Effort: Pulmonary effort is normal. No respiratory distress.     Breath sounds: Normal breath sounds.  Abdominal:     General: Bowel sounds are normal.     Palpations: Abdomen is soft.     Tenderness: There is no abdominal tenderness.     Comments: Protuberant, consistent with his level of obesity.  He points to an area around his belt line, which is where he says he has the bulge.  No hernia is identified in this location.  Genitourinary:    Comments: No hernia appreciated in either inguinal canal. Musculoskeletal:        General: No deformity or signs of injury.  Lymphadenopathy:     Cervical: No cervical adenopathy.  Skin:    General: Skin is warm and dry.  Neurological:     General: No focal deficit present.     Mental Status: He is alert and oriented to person, place, and time.  Psychiatric:        Mood and Affect: Mood normal.        Behavior: Behavior normal.     Data Reviewed See the history of present illness were I reviewed the notes from his primary care provider, urology, and the results of the scrotal ultrasound.  Assessment This is a 29 year old man who has pain in his left groin that seems to radiate down into his thigh and scrotum.  He was referred for evaluation of a hernia.  The area that he says bulges does not correlate to an inguinal or femoral hernia and on my exam I do not appreciate a bulge or defect in this location.  I do not appreciate hernias in either inguinal canal.  I am not certain what the etiology of his pain might be, but I do not think an inguinal hernia is the cause.  Plan Encouraged him to continue to follow-up with his primary care provider.  Additional imaging studies may be necessary, such as a CT scan or MRI to evaluate for potential nerve  impingement.  I encouraged him to at least try taking the amitriptyline that was prescribed, although gabapentin or Lyrica may be a better option.  I will defer that decision to his primary care provider.  At this time, I do not think he has any surgical indications that I can assist with.  If further imaging demonstrates that there is something I missed on my physical examination, he  is certainly welcome to return for reevaluation.    Duanne Guess 02/27/2020, 10:10 AM

## 2020-02-27 NOTE — Patient Instructions (Addendum)
Dr.Cannon recommends patient to try the prior prescribed Amitriptyline to help with the pain he is experiencing.  Follow-up with our office as needed.  Please call and ask to speak with a nurse if you develop questions or concerns.

## 2021-07-27 ENCOUNTER — Encounter: Payer: Self-pay | Admitting: General Surgery

## 2022-01-04 IMAGING — US US SCROTUM W/ DOPPLER COMPLETE
1 series · 14 of 25 positions shown · non-contrast
Comparison: None.

CLINICAL DATA: Bilateral scrotal pain for over year

EXAM:
SCROTAL ULTRASOUND
DOPPLER ULTRASOUND OF THE TESTICLES
TECHNIQUE: Complete ultrasound examination of the testicles, epididymis, and
other scrotal structures was performed. Color and spectral Doppler
ultrasound were also utilized to evaluate blood flow to the
testicles.

[Series 1: us scrotum w/ doppler complete · 0.07mm/px · 14 of 54 slices shown]
[im 1/54]
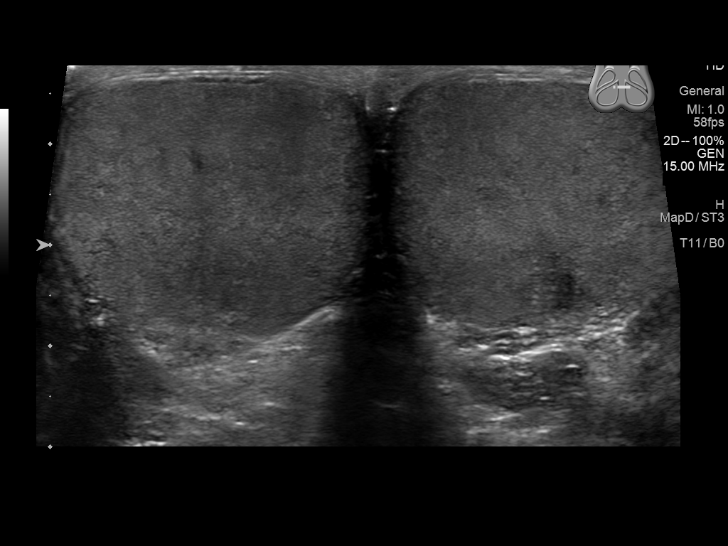
[im 5/54]
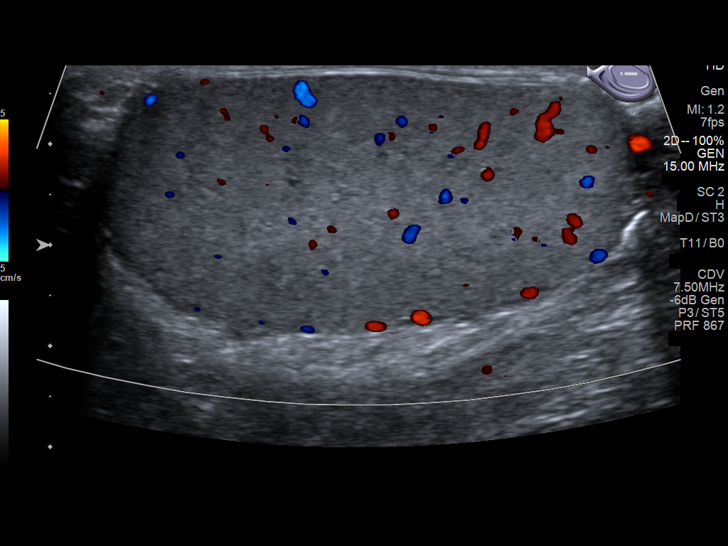
[im 9/54]
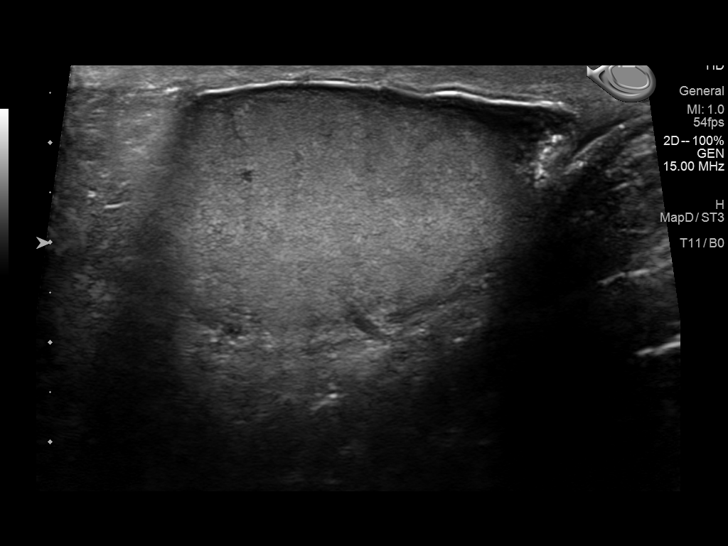
[im 14/54]
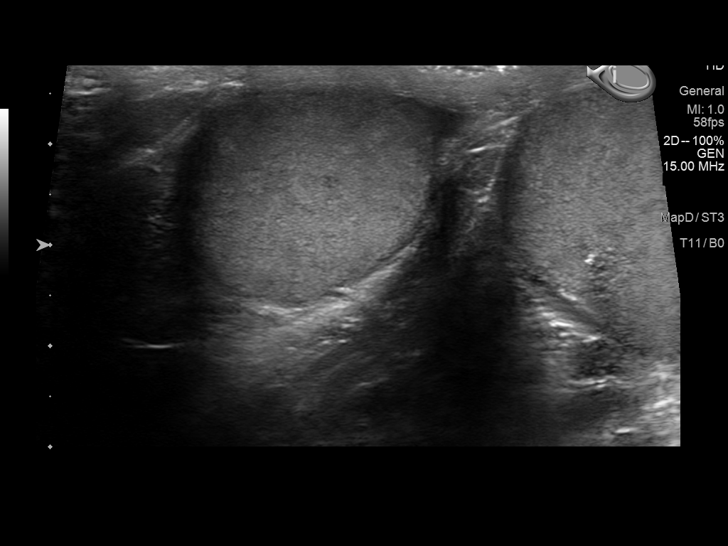
[im 18/54]
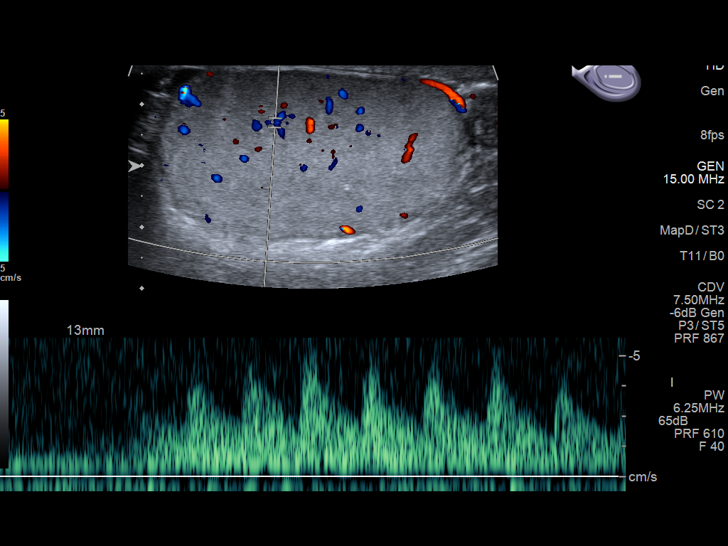
[im 20/54]
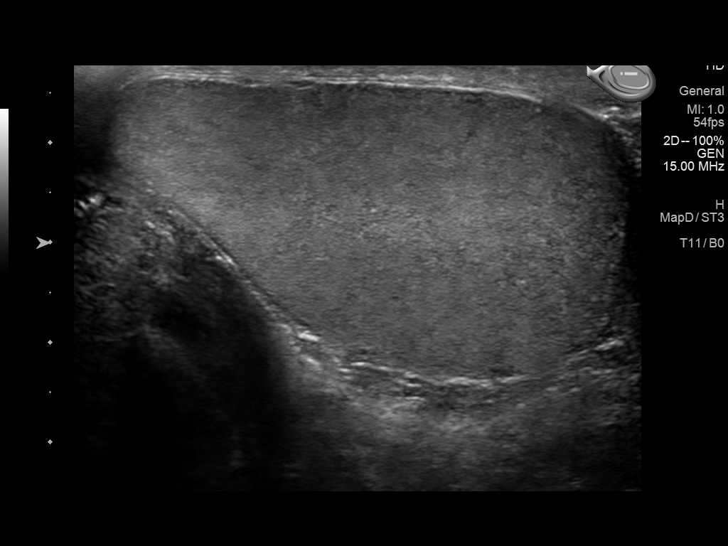
[im 25/54]
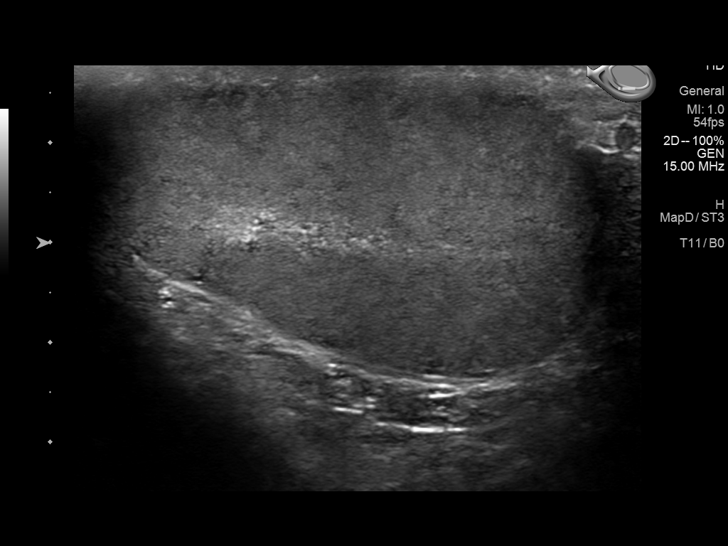
[im 29/54]
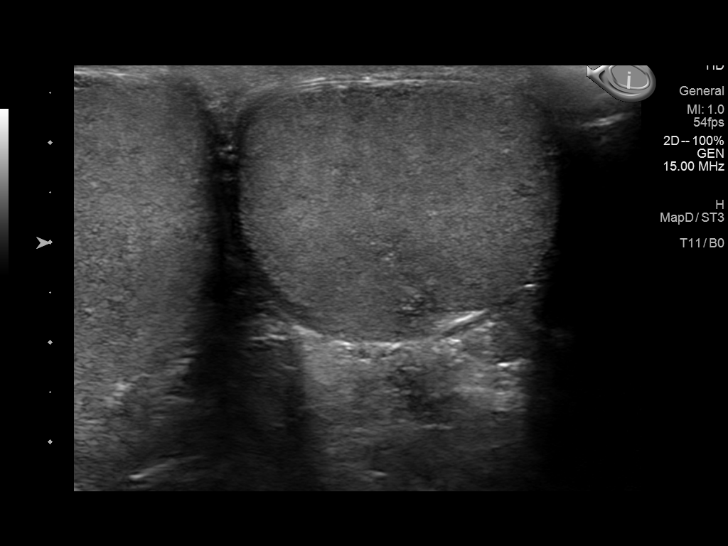
[im 34/54]
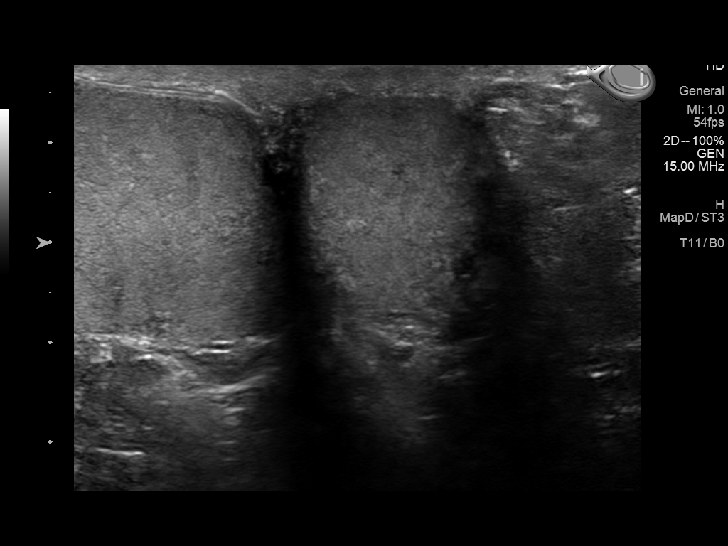
[im 36/54]
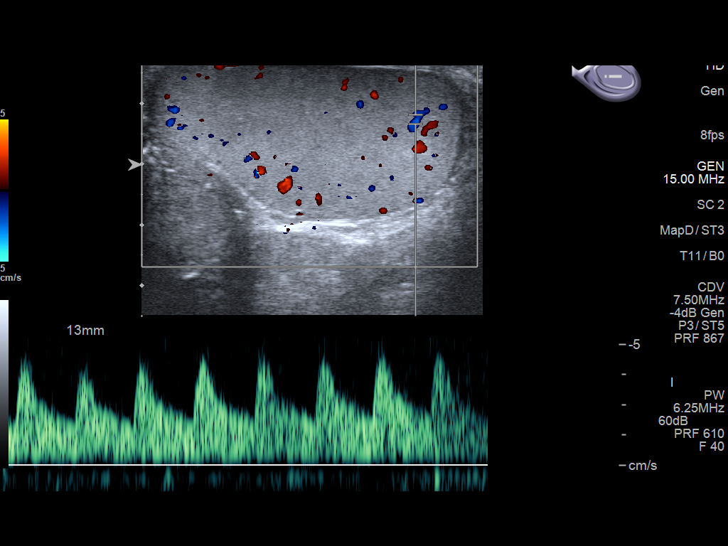
[im 40/54]
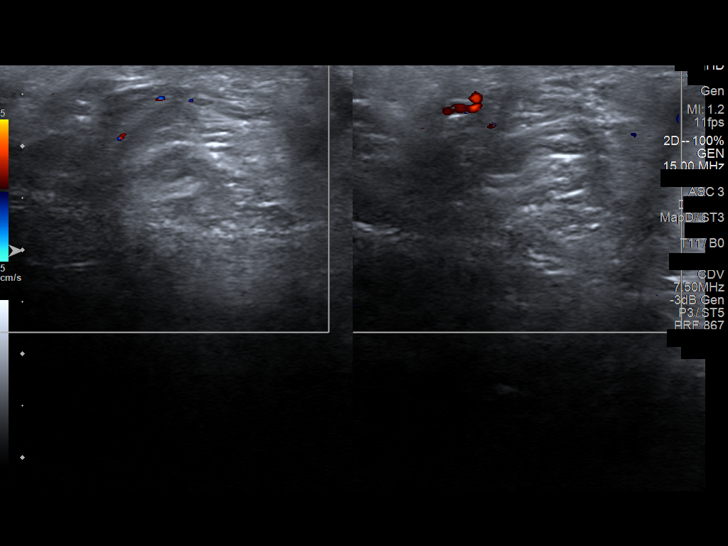
[im 45/54]
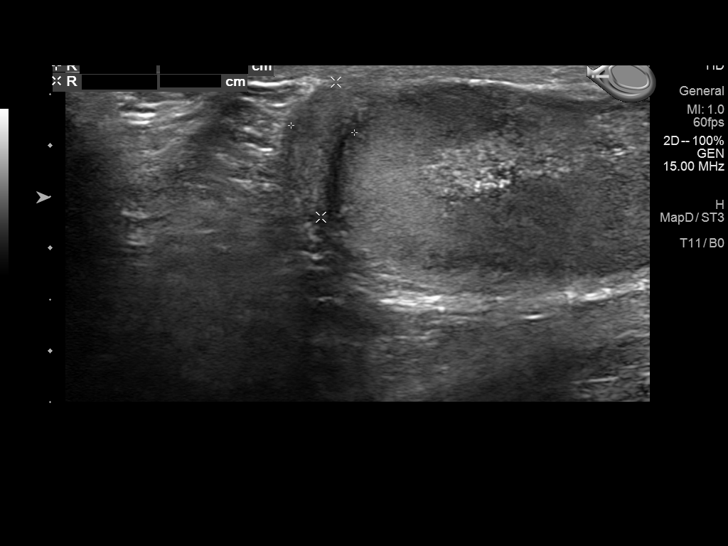
[im 49/54]
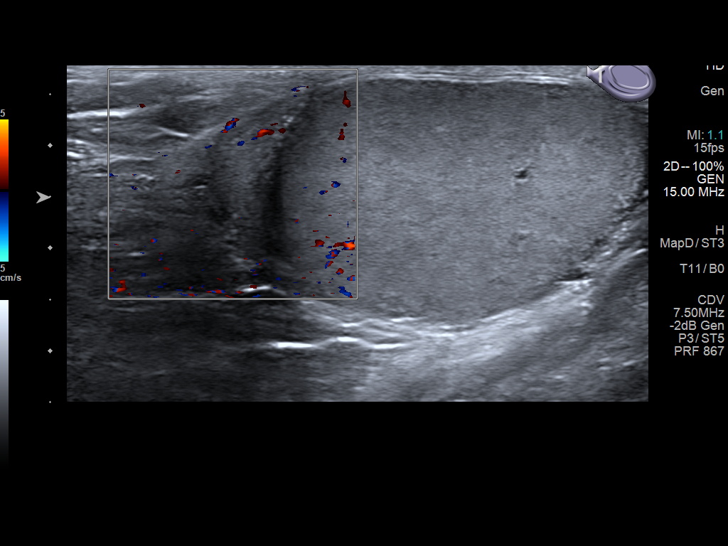
[im 54/54]
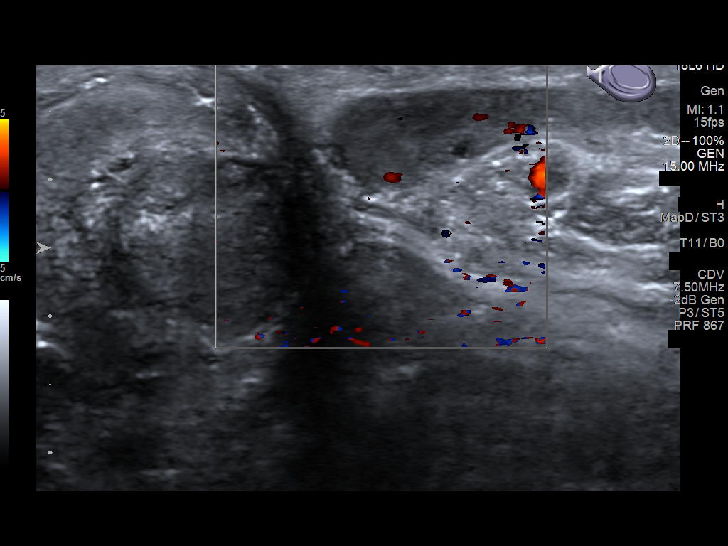

[14 of 25 positions shown; findings below may reference images not displayed]

FINDINGS: Right testicle

Measurements: 5.2 x 2.6 x 3.0 cm . No mass or microlithiasis
visualized.

Left testicle

Measurements: 5.2 x 2.9 x 3.2 cm. No mass or microlithiasis
visualized.

Right epididymis:  Normal in size and appearance.

Left epididymis:  Normal in size and appearance.

Hydrocele:  Trace hydrocele on the right.

Varicocele:  None visualized.

Pulsed Doppler interrogation of both testes demonstrates normal low
resistance arterial and venous waveforms bilaterally.
IMPRESSION: Trace right hydrocele otherwise normal examination.

## 2023-08-21 ENCOUNTER — Encounter: Payer: Self-pay | Admitting: Family

## 2023-08-21 ENCOUNTER — Ambulatory Visit (INDEPENDENT_AMBULATORY_CARE_PROVIDER_SITE_OTHER): Payer: BC Managed Care – PPO | Admitting: Family

## 2023-08-21 VITALS — BP 150/100 | HR 99 | Ht 71.0 in | Wt 310.2 lb

## 2023-08-21 DIAGNOSIS — R7303 Prediabetes: Secondary | ICD-10-CM | POA: Diagnosis not present

## 2023-08-21 DIAGNOSIS — E538 Deficiency of other specified B group vitamins: Secondary | ICD-10-CM

## 2023-08-21 DIAGNOSIS — Z1159 Encounter for screening for other viral diseases: Secondary | ICD-10-CM

## 2023-08-21 DIAGNOSIS — I10 Essential (primary) hypertension: Secondary | ICD-10-CM

## 2023-08-21 DIAGNOSIS — Z0000 Encounter for general adult medical examination without abnormal findings: Secondary | ICD-10-CM

## 2023-08-21 DIAGNOSIS — E782 Mixed hyperlipidemia: Secondary | ICD-10-CM

## 2023-08-21 DIAGNOSIS — R5383 Other fatigue: Secondary | ICD-10-CM

## 2023-08-21 DIAGNOSIS — E559 Vitamin D deficiency, unspecified: Secondary | ICD-10-CM

## 2023-08-21 DIAGNOSIS — Z114 Encounter for screening for human immunodeficiency virus [HIV]: Secondary | ICD-10-CM

## 2023-08-21 NOTE — Progress Notes (Signed)
Complete physical exam  Patient: Shane Morrison   DOB: April 27, 1991   32 y.o. Male  MRN: [id number]  Subjective:    Chief Complaint  Patient presents with   Annual Exam    CPE    Ausencio Morrison is a 32 y.o. male who presents today for a complete physical exam. He reports consuming a general diet. The patient has a physically strenuous job, but has no regular exercise apart from work.  He generally feels fairly well. He reports sleeping fairly well. He does not have additional problems to discuss today.    Most recent fall risk assessment:    08/21/2023   10:50 AM  Fall Risk   Falls in the past year? 0  Number falls in past yr: 0  Injury with Fall? 0  Risk for fall due to : No Fall Risks  Follow up Falls evaluation completed     Most recent depression screenings:    08/21/2023   10:50 AM  PHQ 2/9 Scores  PHQ - 2 Score 0      Past Medical History:  Diagnosis Date   Asthma    childhood   Disease due to 2019 novel coronavirus 11/11/2019   Esophageal reflux 06/11/2019   Hyperlipidemia    Obesity     Past Surgical History:  Procedure Laterality Date   COLONOSCOPY WITH PROPOFOL N/A 01/06/2017   Procedure: COLONOSCOPY WITH PROPOFOL;  Surgeon: Midge Minium, MD;  Location: [facility];  Service: Endoscopy;  Laterality: N/A;   WISDOM TOOTH EXTRACTION      Family History  Problem Relation Age of Onset   Hypertension Mother    Hyperlipidemia Mother     Social History   Socioeconomic History   Marital status: Single    Spouse name: Not on file   Number of children: Not on file   Years of education: Not on file   Highest education level: Not on file  Occupational History   Not on file  Tobacco Use   Smoking status: Never   Smokeless tobacco: Current    Types: Chew  Substance and Sexual Activity   Alcohol use: Yes    Alcohol/week: 15.0 standard drinks of alcohol    Types: 15 Cans of beer per week   Drug use: No   Sexual activity: Not on file   Other Topics Concern   Not on file  Social History Narrative   Not on file   Social Determinants of Health   Financial Resource Strain: Not on file  Food Insecurity: Not on file  Transportation Needs: Not on file  Physical Activity: Not on file  Stress: Not on file  Social Connections: Not on file  Intimate Partner Violence: Not on file    Outpatient Medications Prior to Visit  Medication Sig   albuterol (VENTOLIN HFA) 108 (90 Base) MCG/ACT inhaler SMARTSIG:1-2 Puff(s) By Mouth Every 4-6 Hours PRN (Patient not taking: Reported on 08/21/2023)   amitriptyline (ELAVIL) 25 MG tablet Take 1 tablet (25 mg total) by mouth at bedtime. (Patient not taking: Reported on 02/27/2020)   Cholecalciferol 1.25 MG (50000 UT) capsule Vitamin D3 1.25 MG (50000 UT) Oral Capsule QTY: 12 capsule Days: 84 Refills: 3  Written: 01/29/19 Patient Instructions: once a week (Patient not taking: Reported on 08/21/2023)   No facility-administered medications prior to visit.    Review of Systems  All other systems reviewed and are negative.       Objective:     BP (!) 150/100  Pulse 99   Ht 5\' 11"  (1.803 m)   Wt (!) 310 lb 3.2 oz (140.7 kg)   SpO2 95%   BMI 43.26 kg/m    Physical Exam Vitals and nursing note reviewed.  Constitutional:      Appearance: Normal appearance. He is normal weight.  HENT:     Head: Normocephalic and atraumatic.     Nose: Nose normal.  Eyes:     Extraocular Movements: Extraocular movements intact.     Conjunctiva/sclera: Conjunctivae normal.     Pupils: Pupils are equal, round, and reactive to light.  Cardiovascular:     Rate and Rhythm: Normal rate and regular rhythm.     Pulses: Normal pulses.     Heart sounds: Normal heart sounds.  Pulmonary:     Effort: Pulmonary effort is normal.     Breath sounds: Normal breath sounds.  Abdominal:     General: Bowel sounds are normal.     Palpations: Abdomen is soft.  Musculoskeletal:        General: Normal range of  motion.     Cervical back: Normal range of motion.  Neurological:     General: No focal deficit present.     Mental Status: He is alert and oriented to person, place, and time. Mental status is at baseline.  Psychiatric:        Mood and Affect: Mood normal.        Behavior: Behavior normal.        Thought Content: Thought content normal.        Judgment: Judgment normal.      No results found for any visits on 08/21/23.  No results found for this or any previous visit (from the past 2160 hour(s)).      Assessment & Plan:    Routine Health Maintenance and Physical Exam   There is no immunization history on file for this patient.  Health Maintenance  Topic Date Due   Hepatitis C Screening  Never done   DTaP/Tdap/Td (1 - Tdap) Never done   COVID-19 Vaccine (1 - 2023-24 season) Never done   INFLUENZA VACCINE  08/20/2024 (Originally 06/08/2023)   HIV Screening  Completed   HPV VACCINES  Aged Out    Discussed health benefits of physical activity, and encouraged him to engage in regular exercise appropriate for his age and condition.  Problem List Items Addressed This Visit   None Visit Diagnoses     Routine general medical examination at a health care facility    -  Primary   CPE completed today.  Form completed for patient.  Will repeat CPE in 1 year.   B12 deficiency due to diet       Checking labs today.  Will continue supplements as needed.   Relevant Orders   CMP14+EGFR   Vitamin B12   CBC with Diff   Mixed hyperlipidemia       Checking labs today.  Continue current therapy for lipid control. Will modify as needed based on labwork results.   Relevant Orders   Lipid panel   CMP14+EGFR   CBC with Diff   Prediabetes       Patient counseled on dietary choices and verbalized understanding. Will reassess at follow up after next lab check.   Relevant Orders   CMP14+EGFR   Hemoglobin A1c   CBC with Diff   Vitamin D deficiency, unspecified       Checking labs  today.  Will continue supplements as needed.  Relevant Orders   VITAMIN D 25 Hydroxy (Vit-D Deficiency, Fractures)   CMP14+EGFR   CBC with Diff   Need for hepatitis C screening test       Test ordered today. Will call with results.   Relevant Orders   CMP14+EGFR   CBC with Diff   Hepatitis C Ab reflex to Quant PCR   Screening for HIV without presence of risk factors       Test ordered today  Will call with resutls.   Relevant Orders   CMP14+EGFR   CBC with Diff   HIV antibody (with reflex)   Other fatigue       Checking labs today.  Will continue supplements as needed.   Relevant Orders   CMP14+EGFR   TSH   CBC with Diff      Return in 1 year for repeat CPE or as needed.     Miki Kins, FNP  08/21/2023   This document may have been prepared by Dragon Voice Recognition software and as such may include unintentional dictation errors.

## 2023-08-21 NOTE — Patient Instructions (Signed)
Health Maintenance, Male Adopting a healthy lifestyle and getting preventive care are important in promoting health and wellness. Ask your health care provider about: The right schedule for you to have regular tests and exams. Things you can do on your own to prevent diseases and keep yourself healthy. What should I know about diet, weight, and exercise? Eat a healthy diet  Eat a diet that includes plenty of vegetables, fruits, low-fat dairy products, and lean protein. Do not eat a lot of foods that are high in solid fats, added sugars, or sodium. Maintain a healthy weight Body mass index (BMI) is a measurement that can be used to identify possible weight problems. It estimates body fat based on height and weight. Your health care provider can help determine your BMI and help you achieve or maintain a healthy weight. Get regular exercise Get regular exercise. This is one of the most important things you can do for your health. Most adults should: Exercise for at least 150 minutes each week. The exercise should increase your heart rate and make you sweat (moderate-intensity exercise). Do strengthening exercises at least twice a week. This is in addition to the moderate-intensity exercise. Spend less time sitting. Even light physical activity can be beneficial. Watch cholesterol and blood lipids Have your blood tested for lipids and cholesterol at 33 years of age, then have this test every 5 years. You may need to have your cholesterol levels checked more often if: Your lipid or cholesterol levels are high. You are older than 33 years of age. You are at high risk for heart disease. What should I know about cancer screening? Many types of cancers can be detected early and may often be prevented. Depending on your health history and family history, you may need to have cancer screening at various ages. This may include screening for: Colorectal cancer. Prostate cancer. Skin cancer. Lung  cancer. What should I know about heart disease, diabetes, and high blood pressure? Blood pressure and heart disease High blood pressure causes heart disease and increases the risk of stroke. This is more likely to develop in people who have high blood pressure readings or are overweight. Talk with your health care provider about your target blood pressure readings. Have your blood pressure checked: Every 3-5 years if you are 4-69 years of age. Every year if you are 32 years old or older. If you are between the ages of 67 and 110 and are a current or former smoker, ask your health care provider if you should have a one-time screening for abdominal aortic aneurysm (AAA). Diabetes Have regular diabetes screenings. This checks your fasting blood sugar level. Have the screening done: Once every three years after age 110 if you are at a normal weight and have a low risk for diabetes. More often and at a younger age if you are overweight or have a high risk for diabetes. What should I know about preventing infection? Hepatitis B If you have a higher risk for hepatitis B, you should be screened for this virus. Talk with your health care provider to find out if you are at risk for hepatitis B infection. Hepatitis C Blood testing is recommended for: Everyone born from [birth year] through 1966. Anyone with known risk factors for hepatitis C. Sexually transmitted infections (STIs) You should be screened each year for STIs, including gonorrhea and chlamydia, if: You are sexually active and are younger than 33 years of age. You are older than 33 years of age and your  health care provider tells you that you are at risk for this type of infection. Your sexual activity has changed since you were last screened, and you are at increased risk for chlamydia or gonorrhea. Ask your health care provider if you are at risk. Ask your health care provider about whether you are at high risk for HIV. Your health care provider  may recommend a prescription medicine to help prevent HIV infection. If you choose to take medicine to prevent HIV, you should first get tested for HIV. You should then be tested every 3 months for as long as you are taking the medicine. Follow these instructions at home: Alcohol use Do not drink alcohol if your health care provider tells you not to drink. If you drink alcohol: Limit how much you have to 0-2 drinks a day. Know how much alcohol is in your drink. In the [place] one drink equals one 12 oz bottle of beer (355 mL), one 5 oz glass of wine (148 mL), or one 1 oz glass of hard liquor (44 mL). Lifestyle Do not use any products that contain nicotine or tobacco. These products include cigarettes, chewing tobacco, and vaping devices, such as e-cigarettes. If you need help quitting, ask your health care provider. Do not use street drugs. Do not share needles. Ask your health care provider for help if you need support or information about quitting drugs. General instructions Schedule regular health, dental, and eye exams. Stay current with your vaccines. Tell your health care provider if: You often feel depressed. You have ever been abused or do not feel safe at home. Summary Adopting a healthy lifestyle and getting preventive care are important in promoting health and wellness. Follow your health care provider's instructions about healthy diet, exercising, and getting tested or screened for diseases. Follow your health care provider's instructions on monitoring your cholesterol and blood pressure. This information is not intended to replace advice given to you by your health care provider. Make sure you discuss any questions you have with your health care provider. Document Revised: 03/15/2021 Document Reviewed: 03/15/2021 Elsevier Patient Education  2024 [employer]  Fat and Cholesterol Restricted Eating Plan Getting too much fat and cholesterol in your diet may cause health  problems. Choosing the right foods helps keep your fat and cholesterol at normal levels. This can keep you from getting certain diseases. Your doctor may recommend an eating plan that includes: Total fat: ______% or less of total calories a day. This is ______g of fat a day. Saturated fat: ______% or less of total calories a day. This is ______g of saturated fat a day. Cholesterol: less than _________mg a day. Fiber: ______g a day. What are tips for following this plan? General tips Work with your doctor to lose weight if you need to. Avoid: Foods with added sugar. Fried foods. Foods with trans fat or partially hydrogenated oils. This includes some margarines and baked goods. If you drink alcohol: Limit how much you have to: 0-1 drink a day for women who are not pregnant. 0-2 drinks a day for men. Know how much alcohol is in a drink. In the [place] one drink equals one 12 oz bottle of beer (355 mL), one 5 oz glass of wine (148 mL), or one 1 oz glass of hard liquor (44 mL). Reading food labels Check food labels for: Trans fats. Partially hydrogenated oils. Saturated fat (g) in each serving. Cholesterol (mg) in each serving. Fiber (g) in each serving. Choose foods with healthy  fats, such as: Monounsaturated fats and polyunsaturated fats. These include olive and canola oil, flaxseeds, walnuts, almonds, and seeds. Omega-3 fats. These are found in certain fish, flaxseed oil, and ground flaxseeds. Choose grain products that have whole grains. Look for the word "whole" as the first word in the ingredient list. Cooking Cook foods using low-fat methods. These include baking, boiling, grilling, and broiling. Eat more home-cooked foods. Eat at restaurants and buffets less often. Eat less fast food. Avoid cooking using saturated fats, such as butter, cream, palm oil, palm kernel oil, and coconut oil. Meal planning  At meals, divide your plate into four equal parts: Fill one-half of your plate  with vegetables, green salads, and fruit. Fill one-fourth of your plate with whole grains. Fill one-fourth of your plate with low-fat (lean) protein foods. Eat fish that is high in omega-3 fats at least two times a week. This includes mackerel, tuna, sardines, and salmon. Eat foods that are high in fiber, such as whole grains, beans, apples, pears, berries, broccoli, carrots, peas, and barley. What foods should I eat? Fruits All fresh, canned (in natural juice), or frozen fruits. Vegetables Fresh or frozen vegetables (raw, steamed, roasted, or grilled). Green salads. Grains Whole grains, such as whole wheat or whole grain breads, crackers, cereals, and pasta. Unsweetened oatmeal, bulgur, barley, quinoa, or brown rice. Corn or whole wheat flour tortillas. Meats and other protein foods Ground beef (85% or leaner), grass-fed beef, or beef trimmed of fat. Skinless chicken or [place]. Ground chicken or [place]. Pork trimmed of fat. All fish and seafood. Egg whites. Dried beans, peas, or lentils. Unsalted nuts or seeds. Unsalted canned beans. Nut butters without added sugar or oil. Dairy Low-fat or nonfat dairy products, such as skim or 1% milk, 2% or reduced-fat cheeses, low-fat and fat-free ricotta or cottage cheese, or plain low-fat and nonfat yogurt. Fats and oils Tub margarine without trans fats. Light or reduced-fat mayonnaise and salad dressings. Avocado. Olive, canola, sesame, or safflower oils. The items listed above may not be a complete list of foods and beverages you can eat. Contact a dietitian for more information. What foods should I avoid? Fruits Canned fruit in heavy syrup. Fruit in cream or butter sauce. Fried fruit. Vegetables Vegetables cooked in cheese, cream, or butter sauce. Fried vegetables. Grains White bread. White pasta. White rice. Cornbread. Bagels, pastries, and croissants. Crackers and snack foods that contain trans fat and hydrogenated oils. Meats and other protein  foods Fatty cuts of meat. Ribs, chicken wings, bacon, sausage, bologna, salami, chitterlings, fatback, hot dogs, bratwurst, and packaged lunch meats. Liver and organ meats. Whole eggs and egg yolks. Chicken and [place] with skin. Fried meat. Dairy Whole or 2% milk, cream, half-and-half, and cream cheese. Whole milk cheeses. Whole-fat or sweetened yogurt. Full-fat cheeses. Nondairy creamers and whipped toppings. Processed cheese, cheese spreads, and cheese curds. Fats and oils Butter, stick margarine, lard, shortening, ghee, or bacon fat. Coconut, palm kernel, and palm oils. Beverages Alcohol. Sugar-sweetened drinks such as sodas, lemonade, and fruit drinks. Sweets and desserts Corn syrup, sugars, honey, and molasses. Candy. Jam and jelly. Syrup. Sweetened cereals. Cookies, pies, cakes, donuts, muffins, and ice cream. The items listed above may not be a complete list of foods and beverages you should avoid. Contact a dietitian for more information. Summary Choosing the right foods helps keep your fat and cholesterol at normal levels. This can keep you from getting certain diseases. At meals, fill one-half of your plate with vegetables, green salads, and  fruits. Eat high fiber foods, like whole grains, beans, apples, pears, berries, carrots, peas, and barley. Limit added sugar, saturated fats, alcohol, and fried foods. This information is not intended to replace advice given to you by your health care provider. Make sure you discuss any questions you have with your health care provider. Document Revised: 03/05/2021 Document Reviewed: 03/05/2021 Elsevier Patient Education  2024 [employer].

## 2023-08-22 LAB — CBC WITH DIFFERENTIAL/PLATELET
Basophils Absolute: 0.1 10*3/uL (ref 0.0–0.2)
Basos: 1 %
EOS (ABSOLUTE): 0.5 10*3/uL — ABNORMAL HIGH (ref 0.0–0.4)
Eos: 6 %
Hematocrit: 49.3 % (ref 37.5–51.0)
Hemoglobin: 16.1 g/dL (ref 13.0–17.7)
Immature Grans (Abs): 0 10*3/uL (ref 0.0–0.1)
Immature Granulocytes: 0 %
Lymphocytes Absolute: 1.8 10*3/uL (ref 0.7–3.1)
Lymphs: 23 %
MCH: 29.9 pg (ref 26.6–33.0)
MCHC: 32.7 g/dL (ref 31.5–35.7)
MCV: 92 fL (ref 79–97)
Monocytes Absolute: 0.6 10*3/uL (ref 0.1–0.9)
Monocytes: 8 %
Neutrophils Absolute: 4.9 10*3/uL (ref 1.4–7.0)
Neutrophils: 62 %
Platelets: 251 10*3/uL (ref 150–450)
RBC: 5.39 x10E6/uL (ref 4.14–5.80)
RDW: 12.6 % (ref 11.6–15.4)
WBC: 7.8 10*3/uL (ref 3.4–10.8)

## 2023-08-22 LAB — HCV INTERPRETATION

## 2023-08-22 LAB — CMP14+EGFR
ALT: 36 [IU]/L (ref 0–44)
AST: 22 [IU]/L (ref 0–40)
Albumin: 4.6 g/dL (ref 4.1–5.1)
Alkaline Phosphatase: 79 [IU]/L (ref 44–121)
BUN/Creatinine Ratio: 11 (ref 9–20)
BUN: 13 mg/dL (ref 6–20)
Bilirubin Total: 0.5 mg/dL (ref 0.0–1.2)
CO2: 23 mmol/L (ref 20–29)
Calcium: 9.5 mg/dL (ref 8.7–10.2)
Chloride: 102 mmol/L (ref 96–106)
Creatinine, Ser: 1.15 mg/dL (ref 0.76–1.27)
Globulin, Total: 3 g/dL (ref 1.5–4.5)
Glucose: 98 mg/dL (ref 70–99)
Potassium: 4.6 mmol/L (ref 3.5–5.2)
Sodium: 139 mmol/L (ref 134–144)
Total Protein: 7.6 g/dL (ref 6.0–8.5)
eGFR: 86 mL/min/{1.73_m2} (ref >59)

## 2023-08-22 LAB — LIPID PANEL
Chol/HDL Ratio: 4.1 {ratio} (ref 0.0–5.0)
Cholesterol, Total: 192 mg/dL (ref 100–199)
HDL: 47 mg/dL (ref >39)
LDL Chol Calc (NIH): 127 mg/dL — ABNORMAL HIGH (ref 0–99)
Triglycerides: 97 mg/dL (ref 0–149)
VLDL Cholesterol Cal: 18 mg/dL (ref 5–40)

## 2023-08-22 LAB — HIV ANTIBODY (ROUTINE TESTING W REFLEX): HIV Screen 4th Generation wRfx: NONREACTIVE

## 2023-08-22 LAB — VITAMIN B12: Vitamin B-12: 606 pg/mL (ref 232–1245)

## 2023-08-22 LAB — HEMOGLOBIN A1C
Est. average glucose Bld gHb Est-mCnc: 105 mg/dL
Hgb A1c MFr Bld: 5.3 % (ref 4.8–5.6)

## 2023-08-22 LAB — VITAMIN D 25 HYDROXY (VIT D DEFICIENCY, FRACTURES): Vit D, 25-Hydroxy: 26.7 ng/mL — ABNORMAL LOW (ref 30.0–100.0)

## 2023-08-22 LAB — HCV AB W REFLEX TO QUANT PCR: HCV Ab: NONREACTIVE

## 2023-08-22 LAB — TSH: TSH: 1.18 u[IU]/mL (ref 0.450–4.500)

## 2024-06-28 ENCOUNTER — Encounter: Payer: Self-pay | Admitting: Cardiology

## 2024-06-28 ENCOUNTER — Ambulatory Visit (INDEPENDENT_AMBULATORY_CARE_PROVIDER_SITE_OTHER): Admitting: Cardiology

## 2024-06-28 VITALS — BP 136/84 | HR 94 | Ht 71.0 in | Wt 309.4 lb

## 2024-06-28 DIAGNOSIS — Z1329 Encounter for screening for other suspected endocrine disorder: Secondary | ICD-10-CM

## 2024-06-28 DIAGNOSIS — Z0000 Encounter for general adult medical examination without abnormal findings: Secondary | ICD-10-CM | POA: Insufficient documentation

## 2024-06-28 DIAGNOSIS — Z13220 Encounter for screening for lipoid disorders: Secondary | ICD-10-CM

## 2024-06-28 DIAGNOSIS — Z0001 Encounter for general adult medical examination with abnormal findings: Secondary | ICD-10-CM

## 2024-06-28 DIAGNOSIS — Z131 Encounter for screening for diabetes mellitus: Secondary | ICD-10-CM | POA: Diagnosis not present

## 2024-06-28 DIAGNOSIS — E538 Deficiency of other specified B group vitamins: Secondary | ICD-10-CM | POA: Diagnosis not present

## 2024-06-28 DIAGNOSIS — E559 Vitamin D deficiency, unspecified: Secondary | ICD-10-CM | POA: Diagnosis not present

## 2024-06-28 DIAGNOSIS — I10 Essential (primary) hypertension: Secondary | ICD-10-CM

## 2024-06-28 NOTE — Progress Notes (Signed)
 Complete physical exam  Patient: Shane Morrison   DOB: Jan 24, 1991   33 y.o. Male  MRN: [id number]  Subjective:    Chief Complaint  Patient presents with   Annual Exam    CPE, needs bloodwork    Shane Morrison is a 33 y.o. male who presents today for a complete physical exam. He reports consuming a general diet. The patient does not participate in regular exercise at present. He generally feels well. He reports sleeping well. He does not have additional problems to discuss today.    Most recent fall risk assessment:    08/21/2023   10:50 AM  Fall Risk   Falls in the past year? 0  Number falls in past yr: 0  Injury with Fall? 0  Risk for fall due to : No Fall Risks  Follow up Falls evaluation completed     Most recent depression screenings:    08/21/2023   10:50 AM  PHQ 2/9 Scores  PHQ - 2 Score 0    Vision:Not within last year  and Dental: No current dental problems and No regular dental care   Past Medical History:  Diagnosis Date   Asthma    childhood   Disease due to 2019 novel coronavirus 11/11/2019   Esophageal reflux 06/11/2019   Hyperlipidemia    Obesity     Past Surgical History:  Procedure Laterality Date   COLONOSCOPY WITH PROPOFOL  N/A 01/06/2017   Procedure: COLONOSCOPY WITH PROPOFOL ;  Surgeon: Rogelia Copping, MD;  Location: [facility];  Service: Endoscopy;  Laterality: N/A;   WISDOM TOOTH EXTRACTION      Family History  Problem Relation Age of Onset   Hypertension Mother    Hyperlipidemia Mother     Social History   Socioeconomic History   Marital status: Single    Spouse name: Not on file   Number of children: Not on file   Years of education: Not on file   Highest education level: Not on file  Occupational History   Not on file  Tobacco Use   Smoking status: Never   Smokeless tobacco: Current    Types: Chew  Substance and Sexual Activity   Alcohol use: Yes    Alcohol/week: 15.0 standard drinks of alcohol    Types: 15  Cans of beer per week   Drug use: No   Sexual activity: Not on file  Other Topics Concern   Not on file  Social History Narrative   Not on file   Social Drivers of Health   Financial Resource Strain: Not on file  Food Insecurity: Not on file  Transportation Needs: Not on file  Physical Activity: Not on file  Stress: Not on file  Social Connections: Not on file  Intimate Partner Violence: Not on file    Outpatient Medications Prior to Visit  Medication Sig   albuterol (VENTOLIN HFA) 108 (90 Base) MCG/ACT inhaler SMARTSIG:1-2 Puff(s) By Mouth Every 4-6 Hours PRN (Patient not taking: Reported on 06/28/2024)   amitriptyline  (ELAVIL ) 25 MG tablet Take 1 tablet (25 mg total) by mouth at bedtime. (Patient not taking: Reported on 06/28/2024)   Cholecalciferol 1.25 MG (50000 UT) capsule Vitamin D3 1.25 MG (50000 UT) Oral Capsule QTY: 12 capsule Days: 84 Refills: 3  Written: 01/29/19 Patient Instructions: once a week (Patient not taking: Reported on 06/28/2024)   No facility-administered medications prior to visit.    Review of Systems  Constitutional: Negative.   HENT: Negative.    Eyes: Negative.  Respiratory: Negative.  Negative for shortness of breath.   Cardiovascular: Negative.  Negative for chest pain.  Gastrointestinal: Negative.  Negative for abdominal pain, constipation and diarrhea.  Genitourinary: Negative.   Musculoskeletal:  Negative for joint pain and myalgias.  Skin: Negative.   Neurological: Negative.  Negative for dizziness and headaches.  Endo/Heme/Allergies: Negative.   All other systems reviewed and are negative.       Objective:     BP 136/84   Pulse 94   Ht 5' 11 (1.803 m)   Wt (!) 309 lb 6.4 oz (140.3 kg)   SpO2 97%   BMI 43.15 kg/m  BP Readings from Last 3 Encounters:  06/28/24 136/84  08/21/23 (!) 150/100  02/27/20 (!) 139/92      Physical Exam Nursing note reviewed.  Constitutional:      Appearance: Normal appearance. He is normal  weight.  HENT:     Head: Normocephalic and atraumatic.     Nose: Nose normal.     Mouth/Throat:     Mouth: Mucous membranes are moist.     Pharynx: Oropharynx is clear.  Eyes:     Extraocular Movements: Extraocular movements intact.     Conjunctiva/sclera: Conjunctivae normal.     Pupils: Pupils are equal, round, and reactive to light.  Cardiovascular:     Rate and Rhythm: Normal rate and regular rhythm.     Pulses: Normal pulses.     Heart sounds: Normal heart sounds.  Pulmonary:     Effort: Pulmonary effort is normal.     Breath sounds: Normal breath sounds.  Abdominal:     General: Abdomen is flat. Bowel sounds are normal.     Palpations: Abdomen is soft.  Musculoskeletal:        General: Normal range of motion.     Cervical back: Normal range of motion.  Skin:    General: Skin is warm and dry.  Neurological:     General: No focal deficit present.     Mental Status: He is alert and oriented to person, place, and time.  Psychiatric:        Mood and Affect: Mood normal.        Behavior: Behavior normal.        Thought Content: Thought content normal.        Judgment: Judgment normal.      No results found for any visits on 06/28/24.  No results found for this or any previous visit (from the past 2160 hours).      Assessment & Plan:    Routine Health Maintenance and Physical Exam   There is no immunization history on file for this patient.  Health Maintenance  Topic Date Due   DTaP/Tdap/Td (1 - Tdap) Never done   Hepatitis B Vaccines 19-59 Average Risk (1 of 3 - 19+ 3-dose series) Never done   HPV VACCINES (1 - 3-dose SCDM series) Never done   COVID-19 Vaccine (1 - 2024-25 season) Never done   INFLUENZA VACCINE  06/07/2024   Hepatitis C Screening  Completed   HIV Screening  Completed   Pneumococcal Vaccine  Aged Out   Meningococcal B Vaccine  Aged Out    Discussed health benefits of physical activity, and encouraged him to engage in regular exercise  appropriate for his age and condition.  Problem List Items Addressed This Visit       Other   Encounter for annual health examination   Other Visit Diagnoses  Diabetes mellitus screening    -  Primary   Relevant Orders   CMP14+EGFR   Hemoglobin A1c     Vitamin D  deficiency, unspecified       Relevant Orders   Vitamin D  (25 hydroxy)     B12 deficiency due to diet       Relevant Orders   Vitamin B12     Thyroid disorder screening       Relevant Orders   TSH     Lipid screening       Relevant Orders   Lipid Profile      Return in about 1 year (around 06/28/2025) for physical with Alan.     Jeoffrey Pollen, NP  06/28/2024   This document may have been prepared by [facility] Voice Recognition software and as such may include unintentional dictation errors.

## 2024-06-29 LAB — CMP14+EGFR
ALT: 28 IU/L (ref 0–44)
AST: 18 IU/L (ref 0–40)
Albumin: 4.8 g/dL (ref 4.1–5.1)
Alkaline Phosphatase: 67 IU/L (ref 44–121)
BUN/Creatinine Ratio: 14 (ref 9–20)
BUN: 14 mg/dL (ref 6–20)
Bilirubin Total: 0.5 mg/dL (ref 0.0–1.2)
CO2: 23 mmol/L (ref 20–29)
Calcium: 9.5 mg/dL (ref 8.7–10.2)
Chloride: 100 mmol/L (ref 96–106)
Creatinine, Ser: 1.02 mg/dL (ref 0.76–1.27)
Globulin, Total: 3 g/dL (ref 1.5–4.5)
Glucose: 103 mg/dL — ABNORMAL HIGH (ref 70–99)
Potassium: 4.4 mmol/L (ref 3.5–5.2)
Sodium: 138 mmol/L (ref 134–144)
Total Protein: 7.8 g/dL (ref 6.0–8.5)
eGFR: 99 mL/min/1.73 (ref >59)

## 2024-06-29 LAB — HEMOGLOBIN A1C
Est. average glucose Bld gHb Est-mCnc: 100 mg/dL
Hgb A1c MFr Bld: 5.1 % (ref 4.8–5.6)

## 2024-06-29 LAB — VITAMIN B12: Vitamin B-12: 795 pg/mL (ref 232–1245)

## 2024-06-29 LAB — TSH: TSH: 3.31 u[IU]/mL (ref 0.450–4.500)

## 2024-06-29 LAB — LIPID PANEL
Chol/HDL Ratio: 4.5 ratio (ref 0.0–5.0)
Cholesterol, Total: 198 mg/dL (ref 100–199)
HDL: 44 mg/dL (ref >39)
LDL Chol Calc (NIH): 135 mg/dL — ABNORMAL HIGH (ref 0–99)
Triglycerides: 107 mg/dL (ref 0–149)
VLDL Cholesterol Cal: 19 mg/dL (ref 5–40)

## 2024-06-29 LAB — VITAMIN D 25 HYDROXY (VIT D DEFICIENCY, FRACTURES): Vit D, 25-Hydroxy: 26.3 ng/mL — ABNORMAL LOW (ref 30.0–100.0)

## 2024-07-01 ENCOUNTER — Ambulatory Visit: Payer: Self-pay | Admitting: Cardiology

## 2024-07-01 NOTE — Progress Notes (Signed)
 Patient informed.
